# Patient Record
Sex: Male | Born: 1980 | Race: White | Hispanic: No | Marital: Single | Smoking: Current every day smoker
Health system: Southern US, Community
[De-identification: ages and names within clinical notes are randomized; demographics above are authoritative.]

---

## 2016-03-07 ENCOUNTER — Emergency Department (HOSPITAL_COMMUNITY): Payer: Worker's Compensation

## 2016-03-07 ENCOUNTER — Encounter (HOSPITAL_COMMUNITY): Payer: Self-pay

## 2016-03-07 ENCOUNTER — Emergency Department (HOSPITAL_COMMUNITY)
Admission: EM | Admit: 2016-03-07 | Discharge: 2016-03-07 | Disposition: A | Discharge status: Home / Self Care | Payer: Worker's Compensation | Attending: Emergency Medicine | Admitting: Emergency Medicine

## 2016-03-07 DIAGNOSIS — S0081XA Abrasion of other part of head, initial encounter: Secondary | ICD-10-CM | POA: Insufficient documentation

## 2016-03-07 DIAGNOSIS — S40022A Contusion of left upper arm, initial encounter: Secondary | ICD-10-CM | POA: Diagnosis not present

## 2016-03-07 DIAGNOSIS — F17200 Nicotine dependence, unspecified, uncomplicated: Secondary | ICD-10-CM | POA: Diagnosis not present

## 2016-03-07 DIAGNOSIS — Y999 Unspecified external cause status: Secondary | ICD-10-CM | POA: Diagnosis not present

## 2016-03-07 DIAGNOSIS — S00411A Abrasion of right ear, initial encounter: Secondary | ICD-10-CM | POA: Diagnosis not present

## 2016-03-07 DIAGNOSIS — Z23 Encounter for immunization: Secondary | ICD-10-CM | POA: Diagnosis not present

## 2016-03-07 DIAGNOSIS — Y92410 Unspecified street and highway as the place of occurrence of the external cause: Secondary | ICD-10-CM | POA: Diagnosis not present

## 2016-03-07 DIAGNOSIS — Y9389 Activity, other specified: Secondary | ICD-10-CM | POA: Insufficient documentation

## 2016-03-07 DIAGNOSIS — M79602 Pain in left arm: Secondary | ICD-10-CM | POA: Diagnosis present

## 2016-03-07 MED ORDER — TETANUS-DIPHTH-ACELL PERTUSSIS 5-2.5-18.5 LF-MCG/0.5 IM SUSP
0.5000 mL | Freq: Once | INTRAMUSCULAR | Status: AC
  Administered 2016-03-07: 0.5 mL via INTRAMUSCULAR
  Filled 2016-03-07: qty 0.5

## 2016-03-07 MED ORDER — OXYCODONE-ACETAMINOPHEN 5-325 MG PO TABS
2.0000 | ORAL_TABLET | Freq: Once | ORAL | Status: AC
  Administered 2016-03-07: 2 via ORAL
  Filled 2016-03-07: qty 2

## 2016-03-07 MED ORDER — IBUPROFEN 800 MG PO TABS: 800.0000 mg | ORAL_TABLET | Freq: Three times a day (TID) | ORAL | Status: AC

## 2016-03-07 MED ORDER — HYDROCODONE-ACETAMINOPHEN 5-325 MG PO TABS: 1.0000 | ORAL_TABLET | ORAL | Status: AC | PRN
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
## 2016-03-10 ENCOUNTER — Encounter: Payer: Self-pay | Admitting: Family

## 2016-03-10 ENCOUNTER — Ambulatory Visit (INDEPENDENT_AMBULATORY_CARE_PROVIDER_SITE_OTHER): Payer: Worker's Compensation | Admitting: Family

## 2016-03-10 VITALS — BP 132/76 | HR 91 | Temp 97.3°F | Ht 76.0 in | Wt 294.8 lb

## 2016-03-10 DIAGNOSIS — S40022A Contusion of left upper arm, initial encounter: Secondary | ICD-10-CM | POA: Diagnosis not present

## 2016-03-10 DIAGNOSIS — S40022D Contusion of left upper arm, subsequent encounter: Secondary | ICD-10-CM

## 2016-03-10 NOTE — Patient Instructions (Signed)
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
